# Patient Record
Sex: Male | Born: 1970 | Race: White | Hispanic: No | Marital: Single | Smoking: Former smoker
Health system: Southern US, Community
[De-identification: ages and names within clinical notes are randomized; demographics above are authoritative.]

## PROBLEM LIST (undated history)

## (undated) DIAGNOSIS — R519 Headache, unspecified: Secondary | ICD-10-CM

## (undated) HISTORY — PX: APPENDECTOMY: SHX54

---

## 2019-04-08 ENCOUNTER — Ambulatory Visit: Payer: Self-pay | Admitting: Orthopedic Surgery

## 2019-04-14 ENCOUNTER — Ambulatory Visit: Payer: Self-pay | Admitting: Orthopedic Surgery

## 2019-04-14 NOTE — H&P (Signed)
  Subjective:   Frank Walker is a pleasant 48 year old male who was in his normal state of health until He injured his self lifting heavy objects at work. Chief complaint is left-sided low back/buttock pain. Previous formalized physical therapy gave minimal Relief. Diagnostic SI joint injection provided significant (>80%), Although temporary relief. This pain continues to be significant and interfering with his quality-of-life. He would like to move forward with surgical intervention. He is scheduled for Left SI JT FUSION  Reviewed Problems Somatic dysfunction of left sacroiliac joint - Onset: 12/17/2018  Allergies  Allergen Reactions  . Ibuprofen Anaphylaxis    Social History   Tobacco Use  . Smoking status: Not on file  Substance Use Topics  . Alcohol use: Not on file    Reviewed Family History Father - No current problems or disability Mother - No current problems or disability  Review of Systems As stated in HPI  Objective:   Clinical exam: Patient is alert and oriented 3. No shortness of breath, chest pain. Heart: Regular rate and rhythm, no rubs, murmurs, or gallops Lungs: Clear auscultation bilaterally Abdomen: Soft and nontender. No loss of bowel and bladder control, no rebound tenderness . Bowel sounds 4. Lumbar spine: Ongoing significant left SI joint pain with direct patient. Positive Gleason's test, positive Faber test. Pain with direct palpation over the SI joint. Moderate low back pain with palpation and range of motion. Neuro: Intermittent dysesthesias into the left lower extremity primarily after the SI joint pain has become significant. Negative nerve root tension signs. Reflexes: Babinski: Negative  Imaging studies: Patient underwent a left SI joint injection on 01/09/19. He reports greater than 80% relief for approximately 1 week.  Assessment:   Frank Walker is a pleasant 48 year old male who was in his normal state of health until He injured his self lifting heavy  objects at work. Diagnostic SI joint injection as confirmed left SI joint as his primary pain generator. Patient would like to move forward with surgical intervention.   Plan:   Left SI joint fusion on 04/23/2019  Risks and benefits of surgery were discussed with the patient. These include: Infection, bleeding, death, stroke, paralysis, ongoing or worse pain, need for additional surgery, nonunion, mal-position of the screws.  We have also discussed the goals of surgery to include:  Goals of surgery: Reduction in pain, and improvement in quality of life.  We have also discussed the post-operative recovery period to include: bathing/showering restrictions, wound healing, activity (and driving) restrictions, medications/pain mangement.  We have also discussed post-operative redflags to include: signs and symptoms of postoperative infection, DVT/PE.  I did review the patient's medication list with him. He is not on any blood thinners and he is not taking the aspirin. He does have an NSAID listed on his medication list, but he states he has not had this for some period of time. I did advise him to avoid any anti-inflammatory medications or aspirin leading up to surgery and he did express an understanding of this.  Patient was provided crutches at today's visit and he will be nonweightbearing for 6 weeks postoperatively on the left lower extremity.  We have not obtained preoperative clearance from the patient's primary care as he is not have one. Per Dr. Rolena Infante, he feels comfortable moving forward with the surgery despite not having a PCP clearance. He will be evaluated by anesthesia at [facility] preoperatively in addition to having blood work, urine testing, chest x-ray, EKG performed.  Follow-up: 2 weeks postoperatively.

## 2019-04-14 NOTE — H&P (Deleted)
  The note originally documented on this encounter has been moved the the encounter in which it belongs.  

## 2019-04-16 NOTE — Pre-Procedure Instructions (Signed)
Frank Walker  04/16/2019      Frank Walker [facility] [place], Frank Walker. [address] [place] [address] Phone: [phone] Fax: [phone]    Your procedure is scheduled on Jan. 6  Report to [facility] entrance A at 6:30 A.M.  Call this number if you have problems the morning of surgery:  [phone]   Remember:  Do not eat or drink after midnight.      Take these medicines the morning of surgery with A SIP OF WATER : none            7 days prior to surgery STOP taking any Aspirin (unless otherwise instructed by your surgeon), Aleve, Naproxen, Ibuprofen, Motrin, Advil, Goody's, BC's, all herbal medications, fish oil, and all vitamins.    Do not wear jewelry, make-up or nail polish.  Do not wear lotions, powders, or perfumes, or deodorant.  Do not shave 48 hours prior to surgery.  Men may shave face and neck.  Do not bring valuables to the hospital.  [facility] is not responsible for any belongings or valuables.  Contacts, dentures or bridgework may not be worn into surgery.  Leave your suitcase in the car.  After surgery it may be brought to your room.  For patients admitted to the hospital, discharge time will be determined by your treatment team.  Patients discharged the day of surgery will not be allowed to drive home.    Special instructions:  Frank Walker- Preparing For Surgery  Before surgery, you can play an important role. Because skin is not sterile, your skin needs to be as free of germs as possible. You can reduce the number of germs on your skin by washing with CHG (chlorahexidine gluconate) Soap before surgery.  CHG is an antiseptic cleaner which kills germs and bonds with the skin to continue killing germs even after washing.    Oral Hygiene is also important to reduce your risk of infection.  Remember - BRUSH YOUR TEETH THE MORNING OF SURGERY WITH YOUR REGULAR TOOTHPASTE  Please do  not use if you have an allergy to CHG or antibacterial soaps. If your skin becomes reddened/irritated stop using the CHG.  Do not shave (including legs and underarms) for at least 48 hours prior to first CHG shower. It is OK to shave your face.  Please follow these instructions carefully.   1. Shower the NIGHT BEFORE SURGERY and the MORNING OF SURGERY with CHG.   2. If you chose to wash your hair, wash your hair first as usual with your normal shampoo.  3. After you shampoo, rinse your hair and body thoroughly to remove the shampoo.  4. Use CHG as you would any other liquid soap. You can apply CHG directly to the skin and wash gently with a scrungie or a clean washcloth.   5. Apply the CHG Soap to your body ONLY FROM THE NECK DOWN.  Do not use on open wounds or open sores. Avoid contact with your eyes, ears, mouth and genitals (private parts). Wash Face and genitals (private parts)  with your normal soap.  6. Wash thoroughly, paying special attention to the area where your surgery will be performed.  7. Thoroughly rinse your body with warm water from the neck down.  8. DO NOT shower/wash with your normal soap after using and rinsing off the CHG Soap.  9. Pat yourself dry with a  CLEAN TOWEL.  10. Wear CLEAN PAJAMAS to bed the night before surgery, wear comfortable clothes the morning of surgery  11. Place CLEAN SHEETS on your bed the night of your first shower and DO NOT SLEEP WITH PETS.    Day of Surgery:  Do not apply any deodorants/lotions.  Please wear clean clothes to the hospital/surgery center.   Remember to brush your teeth WITH YOUR REGULAR TOOTHPASTE.    Please read over the following fact sheets that you were given. Coughing and Deep Breathing, MRSA Information and Surgical Site Infection Prevention

## 2019-04-17 ENCOUNTER — Encounter (HOSPITAL_COMMUNITY): Payer: Self-pay

## 2019-04-17 ENCOUNTER — Ambulatory Visit (HOSPITAL_COMMUNITY)
Admission: RE | Admit: 2019-04-17 | Discharge: 2019-04-17 | Disposition: A | Discharge status: Home / Self Care | Source: Ambulatory Visit | Attending: Orthopedic Surgery | Admitting: Orthopedic Surgery

## 2019-04-17 ENCOUNTER — Encounter (HOSPITAL_COMMUNITY)
Admission: RE | Admit: 2019-04-17 | Discharge: 2019-04-17 | Disposition: A | Discharge status: Home / Self Care | Source: Ambulatory Visit | Attending: Orthopedic Surgery | Admitting: Orthopedic Surgery

## 2019-04-17 ENCOUNTER — Other Ambulatory Visit: Payer: Self-pay

## 2019-04-17 DIAGNOSIS — Z01818 Encounter for other preprocedural examination: Secondary | ICD-10-CM

## 2019-04-17 HISTORY — DX: Headache, unspecified: R51.9

## 2019-04-17 LAB — BASIC METABOLIC PANEL
Anion gap: 10 (ref 5–15)
BUN: 10 mg/dL (ref 6–20)
CO2: 22 mmol/L (ref 22–32)
Calcium: 9.7 mg/dL (ref 8.9–10.3)
Chloride: 105 mmol/L (ref 98–111)
Creatinine, Ser: 0.97 mg/dL (ref 0.61–1.24)
GFR calc Af Amer: >60 mL/min (ref >60)
GFR calc non Af Amer: >60 mL/min (ref >60)
Glucose, Bld: 89 mg/dL (ref 70–99)
Potassium: 4.2 mmol/L (ref 3.5–5.1)
Sodium: 137 mmol/L (ref 135–145)

## 2019-04-17 LAB — SURGICAL PCR SCREEN
MRSA, PCR: NEGATIVE
Staphylococcus aureus: POSITIVE — AB

## 2019-04-17 LAB — CBC
HCT: 49 % (ref 39.0–52.0)
Hemoglobin: 16.9 g/dL (ref 13.0–17.0)
MCH: 33 pg (ref 26.0–34.0)
MCHC: 34.5 g/dL (ref 30.0–36.0)
MCV: 95.7 fL (ref 80.0–100.0)
Platelets: 247 10*3/uL (ref 150–400)
RBC: 5.12 MIL/uL (ref 4.22–5.81)
RDW: 13.1 % (ref 11.5–15.5)
WBC: 7.6 10*3/uL (ref 4.0–10.5)
nRBC: 0 % (ref 0.0–0.2)

## 2019-04-17 LAB — URINALYSIS, ROUTINE W REFLEX MICROSCOPIC
Bacteria, UA: NONE SEEN
Bilirubin Urine: NEGATIVE
Glucose, UA: NEGATIVE mg/dL
Ketones, ur: NEGATIVE mg/dL
Leukocytes,Ua: NEGATIVE
Nitrite: NEGATIVE
Protein, ur: NEGATIVE mg/dL
Specific Gravity, Urine: 1.008 (ref 1.005–1.030)
pH: 6 (ref 5.0–8.0)

## 2019-04-17 LAB — PROTIME-INR
INR: 0.9 (ref 0.8–1.2)
Prothrombin Time: 12.4 seconds (ref 11.4–15.2)

## 2019-04-17 LAB — APTT: aPTT: 27 seconds (ref 24–36)

## 2019-04-17 NOTE — Anesthesia Preprocedure Evaluation (Addendum)
Anesthesia Evaluation  Patient identified by MRN, date of birth, ID band Patient awake    Reviewed: Allergy & Precautions, H&P , NPO status , Patient's Chart, lab work & pertinent test results  Airway Mallampati: I  TM Distance: >3 FB Neck ROM: Full    Dental no notable dental hx. (+) Teeth Intact, Dental Advisory Given, Caps   Pulmonary neg pulmonary ROS, former smoker,    Pulmonary exam normal breath sounds clear to auscultation       Cardiovascular Exercise Tolerance: Good negative cardio ROS Normal cardiovascular exam Rhythm:Regular Rate:Normal     Neuro/Psych  Headaches, negative psych ROS   GI/Hepatic negative GI ROS, Neg liver ROS,   Endo/Other  negative endocrine ROS  Renal/GU negative Renal ROS  negative genitourinary   Musculoskeletal negative musculoskeletal ROS (+)   Abdominal   Peds negative pediatric ROS (+)  Hematology negative hematology ROS (+)   Anesthesia Other Findings   Reproductive/Obstetrics negative OB ROS                            Anesthesia Physical Anesthesia Plan  ASA: II  Anesthesia Plan: General   Post-op Pain Management:    Induction: Intravenous  PONV Risk Score and Plan: 2 and Ondansetron, Dexamethasone and Treatment may vary due to age or medical condition  Airway Management Planned: Oral ETT and LMA  Additional Equipment:   Intra-op Plan:   Post-operative Plan: Extubation in OR  Informed Consent: I have reviewed the patients History and Physical, chart, labs and discussed the procedure including the risks, benefits and alternatives for the proposed anesthesia with the patient or authorized representative who has indicated his/her understanding and acceptance.       Plan Discussed with: Anesthesiologist, CRNA and Surgeon  Anesthesia Plan Comments: (PAT note written 04/17/2019 by Myra Gianotti, PA-C. )       Anesthesia Quick  Evaluation

## 2019-04-17 NOTE — Progress Notes (Signed)
Mupirocin Ointment called into [name] [address] for positive PCR of Staph. Pt notified of result and need to pick up Rx. Pt voiced understanding.

## 2019-04-17 NOTE — Progress Notes (Signed)
Anesthesia Chart Review:  Case: [id number] Date/Time: 04/23/19 0815   Procedure: SACROILIAC JOINT FUSION (Left )   Anesthesia type: General   Pre-op diagnosis: Left sacroiliac dysfunction   Location: [facility] OR ROOM 04 / [place] [facility]   Surgeons: Melina Schools, MD      DISCUSSION: Patient is a 48 year old male scheduled for the above procedure.  History includes former smoker (quit 04/16/06), headaches, appendectomy. BMI is consistent with obesity.  Presurgical COVID-19 test is scheduled for 04/19/2019.  Anesthesia team to evaluate on the day of surgery.   VS: BP 123/79   Pulse 67   Temp 36.8 C   Resp 18   Ht 5\' 9"  (1.753 m)   Wt 103.7 kg   SpO2 100%   BMI 33.77 kg/m    PROVIDERS: Patient, No Pcp Per   LABS: Labs reviewed: Acceptable for surgery. (all labs ordered are listed, but only abnormal results are displayed)  Labs Reviewed  SURGICAL PCR SCREEN - Abnormal; Notable for the following components:      Result Value   Staphylococcus aureus POSITIVE (*)    All other components within normal limits  URINALYSIS, ROUTINE W REFLEX MICROSCOPIC - Abnormal; Notable for the following components:   Color, Urine STRAW (*)    Hgb urine dipstick SMALL (*)    All other components within normal limits  APTT  BASIC METABOLIC PANEL  CBC  PROTIME-INR    IMAGES:  CXR 04/17/19: FINDINGS: Lung volumes are normal. Azygos lobe (normal anatomical variant) incidentally noted. No consolidative airspace disease. No pleural effusions. No pneumothorax. No pulmonary nodule or mass noted. Pulmonary vasculature and the cardiomediastinal silhouette are within normal limits. IMPRESSION: No radiographic evidence of acute cardiopulmonary disease.   EKG: N/A   CV: N/A   Past Medical History:  Diagnosis Date  . Headache     Past Surgical History:  Procedure Laterality Date  . APPENDECTOMY     as a cjild    MEDICATIONS: No current outpatient medications on file.   No current  facility-administered medications for this encounter.    Myra Gianotti, PA-C Surgical Short Stay/Anesthesiology [facility] Phone [phone] [facility] Phone [phone] 04/17/2019 5:28 PM

## 2019-04-17 NOTE — Progress Notes (Signed)
PCP - none Cardiologist - na   Chest x-ray - 04/17/19 EKG - na Stress Test - na ECHO - na Cardiac Cath - na  Sleep Study - na CPAP -   Fasting Blood Sugar - na Checks Blood Sugar _____ times a day  Blood Thinner Instructions:na Aspirin Instructions:    COVID TEST- na   Anesthesia review: order for anesthesia. consult  Patient denies shortness of breath, fever, cough and chest pain at PAT appointment   All instructions explained to the patient, with a verbal understanding of the material. Patient agrees to go over the instructions while at home for a better understanding. Patient also instructed to self quarantine after being tested for COVID-19. The opportunity to ask questions was provided.

## 2019-04-19 ENCOUNTER — Other Ambulatory Visit (HOSPITAL_COMMUNITY)
Admission: RE | Admit: 2019-04-19 | Discharge: 2019-04-19 | Disposition: A | Discharge status: Home / Self Care | Source: Ambulatory Visit | Attending: Orthopedic Surgery | Admitting: Orthopedic Surgery

## 2019-04-19 DIAGNOSIS — Z01818 Encounter for other preprocedural examination: Secondary | ICD-10-CM | POA: Diagnosis not present

## 2019-04-20 LAB — NOVEL CORONAVIRUS, NAA (HOSP ORDER, SEND-OUT TO REF LAB; TAT 18-24 HRS): SARS-CoV-2, NAA: NOT DETECTED

## 2019-04-23 ENCOUNTER — Ambulatory Visit (HOSPITAL_COMMUNITY)
Admission: RE | Admit: 2019-04-23 | Discharge: 2019-04-23 | Disposition: A | Discharge status: Home / Self Care | Attending: Orthopedic Surgery | Admitting: Orthopedic Surgery

## 2019-04-23 ENCOUNTER — Ambulatory Visit (HOSPITAL_COMMUNITY)

## 2019-04-23 ENCOUNTER — Other Ambulatory Visit: Payer: Self-pay

## 2019-04-23 ENCOUNTER — Ambulatory Visit (HOSPITAL_COMMUNITY): Admitting: Anesthesiology

## 2019-04-23 ENCOUNTER — Ambulatory Visit (HOSPITAL_COMMUNITY): Admitting: Vascular Surgery

## 2019-04-23 ENCOUNTER — Encounter (HOSPITAL_COMMUNITY)
Admission: RE | Disposition: A | Discharge status: Home / Self Care | Payer: Self-pay | Source: Home / Self Care | Attending: Orthopedic Surgery

## 2019-04-23 ENCOUNTER — Encounter (HOSPITAL_COMMUNITY): Payer: Self-pay | Admitting: Orthopedic Surgery

## 2019-04-23 DIAGNOSIS — R52 Pain, unspecified: Secondary | ICD-10-CM | POA: Diagnosis present

## 2019-04-23 DIAGNOSIS — M533 Sacrococcygeal disorders, not elsewhere classified: Secondary | ICD-10-CM | POA: Diagnosis not present

## 2019-04-23 DIAGNOSIS — Z419 Encounter for procedure for purposes other than remedying health state, unspecified: Secondary | ICD-10-CM

## 2019-04-23 DIAGNOSIS — Z87891 Personal history of nicotine dependence: Secondary | ICD-10-CM | POA: Insufficient documentation

## 2019-04-23 HISTORY — PX: SACROILIAC JOINT FUSION: SHX6088

## 2019-04-23 SURGERY — SACROILIAC JOINT FUSION
Anesthesia: General | Site: Back | Laterality: Left

## 2019-04-23 MED ORDER — LACTATED RINGERS IV SOLN: INTRAVENOUS | Status: DC | PRN

## 2019-04-23 MED ORDER — ROCURONIUM BROMIDE 10 MG/ML (PF) SYRINGE
PREFILLED_SYRINGE | INTRAVENOUS | Status: AC
  Filled 2019-04-23: qty 10

## 2019-04-23 MED ORDER — MIDAZOLAM HCL 2 MG/2ML IJ SOLN
INTRAMUSCULAR | Status: AC
  Filled 2019-04-23: qty 2

## 2019-04-23 MED ORDER — CEFAZOLIN SODIUM-DEXTROSE 2-4 GM/100ML-% IV SOLN
2.0000 g | INTRAVENOUS | Status: AC
  Administered 2019-04-23: 2 g via INTRAVENOUS

## 2019-04-23 MED ORDER — ONDANSETRON HCL 4 MG PO TABS: 4.0000 mg | ORAL_TABLET | Freq: Three times a day (TID) | ORAL | 0 refills | Status: AC | PRN

## 2019-04-23 MED ORDER — ACETAMINOPHEN 160 MG/5ML PO SOLN: 325.0000 mg | ORAL | Status: DC | PRN

## 2019-04-23 MED ORDER — OXYCODONE HCL 5 MG/5ML PO SOLN: 5.0000 mg | Freq: Once | ORAL | Status: AC | PRN

## 2019-04-23 MED ORDER — ACETAMINOPHEN 325 MG PO TABS: 325.0000 mg | ORAL_TABLET | ORAL | Status: DC | PRN

## 2019-04-23 MED ORDER — OXYCODONE HCL 5 MG PO TABS
ORAL_TABLET | ORAL | Status: AC
  Filled 2019-04-23: qty 1

## 2019-04-23 MED ORDER — MEPERIDINE HCL 25 MG/ML IJ SOLN: 6.2500 mg | INTRAMUSCULAR | Status: DC | PRN

## 2019-04-23 MED ORDER — DEXAMETHASONE SODIUM PHOSPHATE 10 MG/ML IJ SOLN
INTRAMUSCULAR | Status: DC | PRN
  Administered 2019-04-23: 10 mg via INTRAVENOUS

## 2019-04-23 MED ORDER — PROPOFOL 10 MG/ML IV BOLUS
INTRAVENOUS | Status: AC
  Filled 2019-04-23: qty 20

## 2019-04-23 MED ORDER — ONDANSETRON HCL 4 MG/2ML IJ SOLN
INTRAMUSCULAR | Status: AC
  Filled 2019-04-23: qty 2

## 2019-04-23 MED ORDER — PROPOFOL 10 MG/ML IV BOLUS
INTRAVENOUS | Status: DC | PRN
  Administered 2019-04-23: 200 mg via INTRAVENOUS

## 2019-04-23 MED ORDER — ROCURONIUM BROMIDE 10 MG/ML (PF) SYRINGE
PREFILLED_SYRINGE | INTRAVENOUS | Status: DC | PRN
  Administered 2019-04-23: 20 mg via INTRAVENOUS
  Administered 2019-04-23: 50 mg via INTRAVENOUS

## 2019-04-23 MED ORDER — BUPIVACAINE-EPINEPHRINE (PF) 0.25% -1:200000 IJ SOLN
INTRAMUSCULAR | Status: AC
  Filled 2019-04-23: qty 30

## 2019-04-23 MED ORDER — BUPIVACAINE LIPOSOME 1.3 % IJ SUSP
20.0000 mL | Freq: Once | INTRAMUSCULAR | Status: DC
  Filled 2019-04-23: qty 20

## 2019-04-23 MED ORDER — LIDOCAINE 2% (20 MG/ML) 5 ML SYRINGE
INTRAMUSCULAR | Status: AC
  Filled 2019-04-23: qty 5

## 2019-04-23 MED ORDER — METHOCARBAMOL 500 MG PO TABS: 500.0000 mg | ORAL_TABLET | Freq: Three times a day (TID) | ORAL | 0 refills | Status: AC | PRN

## 2019-04-23 MED ORDER — PHENYLEPHRINE 40 MCG/ML (10ML) SYRINGE FOR IV PUSH (FOR BLOOD PRESSURE SUPPORT)
PREFILLED_SYRINGE | INTRAVENOUS | Status: AC
  Filled 2019-04-23: qty 10

## 2019-04-23 MED ORDER — FENTANYL CITRATE (PF) 100 MCG/2ML IJ SOLN
INTRAMUSCULAR | Status: DC | PRN
  Administered 2019-04-23: 100 ug via INTRAVENOUS

## 2019-04-23 MED ORDER — LIDOCAINE 2% (20 MG/ML) 5 ML SYRINGE
INTRAMUSCULAR | Status: DC | PRN
  Administered 2019-04-23: 60 mg via INTRAVENOUS

## 2019-04-23 MED ORDER — 0.9 % SODIUM CHLORIDE (POUR BTL) OPTIME
TOPICAL | Status: DC | PRN
  Administered 2019-04-23: 1000 mL

## 2019-04-23 MED ORDER — SUGAMMADEX SODIUM 200 MG/2ML IV SOLN
INTRAVENOUS | Status: DC | PRN
  Administered 2019-04-23: 200 mg via INTRAVENOUS

## 2019-04-23 MED ORDER — BUPIVACAINE-EPINEPHRINE 0.25% -1:200000 IJ SOLN
INTRAMUSCULAR | Status: DC | PRN
  Administered 2019-04-23: 10 mL
  Administered 2019-04-23: 20 mL

## 2019-04-23 MED ORDER — OXYCODONE HCL 5 MG PO TABS
5.0000 mg | ORAL_TABLET | Freq: Once | ORAL | Status: AC | PRN
  Administered 2019-04-23: 5 mg via ORAL

## 2019-04-23 MED ORDER — BUPIVACAINE LIPOSOME 1.3 % IJ SUSP
INTRAMUSCULAR | Status: DC | PRN
  Administered 2019-04-23: 20 mL

## 2019-04-23 MED ORDER — MIDAZOLAM HCL 2 MG/2ML IJ SOLN
INTRAMUSCULAR | Status: DC | PRN
  Administered 2019-04-23 (×2): 2 mg via INTRAVENOUS

## 2019-04-23 MED ORDER — FENTANYL CITRATE (PF) 250 MCG/5ML IJ SOLN
INTRAMUSCULAR | Status: AC
  Filled 2019-04-23: qty 5

## 2019-04-23 MED ORDER — CEFAZOLIN SODIUM-DEXTROSE 2-4 GM/100ML-% IV SOLN
INTRAVENOUS | Status: AC
  Filled 2019-04-23: qty 100

## 2019-04-23 MED ORDER — PHENYLEPHRINE 40 MCG/ML (10ML) SYRINGE FOR IV PUSH (FOR BLOOD PRESSURE SUPPORT)
PREFILLED_SYRINGE | INTRAVENOUS | Status: DC | PRN
  Administered 2019-04-23 (×2): 80 ug via INTRAVENOUS

## 2019-04-23 MED ORDER — OXYCODONE-ACETAMINOPHEN 10-325 MG PO TABS: 1.0000 | ORAL_TABLET | Freq: Four times a day (QID) | ORAL | 0 refills | Status: AC | PRN

## 2019-04-23 MED ORDER — FENTANYL CITRATE (PF) 100 MCG/2ML IJ SOLN: 25.0000 ug | INTRAMUSCULAR | Status: DC | PRN

## 2019-04-23 MED ORDER — ONDANSETRON HCL 4 MG/2ML IJ SOLN: 4.0000 mg | Freq: Once | INTRAMUSCULAR | Status: DC | PRN

## 2019-04-23 MED ORDER — DEXAMETHASONE SODIUM PHOSPHATE 10 MG/ML IJ SOLN
INTRAMUSCULAR | Status: AC
  Filled 2019-04-23: qty 1

## 2019-04-23 SURGICAL SUPPLY — 55 items
BIT DRILL CANNULATED 7.0X3.2MM (BIT) IMPLANT
BLADE SURG 11 STRL SS (BLADE) ×3 IMPLANT
CLOSURE STERI-STRIP 1/2X4 (GAUZE/BANDAGES/DRESSINGS) ×1
CLSR STERI-STRIP ANTIMIC 1/2X4 (GAUZE/BANDAGES/DRESSINGS) ×2 IMPLANT
COVER SURGICAL LIGHT HANDLE (MISCELLANEOUS) ×3 IMPLANT
COVER WAND RF STERILE (DRAPES) ×3 IMPLANT
DRAPE C-ARM 42X72 X-RAY (DRAPES) ×3 IMPLANT
DRAPE C-ARMOR (DRAPES) ×3 IMPLANT
DRAPE SURG 17X23 STRL (DRAPES) ×3 IMPLANT
DRAPE U-SHAPE 47X51 STRL (DRAPES) ×3 IMPLANT
DRILL CANNULATED 7.0X3.2MM (BIT) ×3
DRSG OPSITE POSTOP 3X4 (GAUZE/BANDAGES/DRESSINGS) IMPLANT
DRSG OPSITE POSTOP 4X6 (GAUZE/BANDAGES/DRESSINGS) ×2 IMPLANT
DURAPREP 26ML APPLICATOR (WOUND CARE) ×3 IMPLANT
ELECT BLADE 4.0 EZ CLEAN MEGAD (MISCELLANEOUS) ×3
ELECT PENCIL ROCKER SW 15FT (MISCELLANEOUS) ×3 IMPLANT
ELECT REM PT RETURN 9FT ADLT (ELECTROSURGICAL) ×3
ELECTRODE BLDE 4.0 EZ CLN MEGD (MISCELLANEOUS) ×1 IMPLANT
ELECTRODE REM PT RTRN 9FT ADLT (ELECTROSURGICAL) ×1 IMPLANT
GLOVE BIOGEL PI IND STRL 8.5 (GLOVE) ×1 IMPLANT
GLOVE BIOGEL PI INDICATOR 8.5 (GLOVE) ×2
GLOVE SS BIOGEL STRL SZ 8.5 (GLOVE) ×1 IMPLANT
GLOVE SUPERSENSE BIOGEL SZ 8.5 (GLOVE) ×2
GOWN STRL REUS W/ TWL LRG LVL3 (GOWN DISPOSABLE) ×1 IMPLANT
GOWN STRL REUS W/TWL 2XL LVL3 (GOWN DISPOSABLE) ×3 IMPLANT
GOWN STRL REUS W/TWL LRG LVL3 (GOWN DISPOSABLE) ×2
IMPL IFUSE 3D 7X35 (Rod) IMPLANT
IMPL IFUSE 7.0X50 (Rod) IMPLANT
IMPLANT IFUSE 3D 7X35 (Rod) ×3 IMPLANT
IMPLANT IFUSE 7.0MMX40MM (Rod) ×2 IMPLANT
IMPLANT IFUSE 7.0X50 (Rod) ×3 IMPLANT
KIT BASIN OR (CUSTOM PROCEDURE TRAY) ×3 IMPLANT
KIT TURNOVER KIT B (KITS) ×3 IMPLANT
NEEDLE 22X1 1/2 (OR ONLY) (NEEDLE) ×3 IMPLANT
NS IRRIG 1000ML POUR BTL (IV SOLUTION) ×3 IMPLANT
PACK LAMINECTOMY ORTHO (CUSTOM PROCEDURE TRAY) ×3 IMPLANT
PACK UNIVERSAL I (CUSTOM PROCEDURE TRAY) ×3 IMPLANT
PAD ARMBOARD 7.5X6 YLW CONV (MISCELLANEOUS) ×6 IMPLANT
PIN PUSH 3.2MM (PIN) ×2 IMPLANT
PIN STEINMAN (PIN) ×2 IMPLANT
PIN STEINMAN 3.2MM (PIN) ×6 IMPLANT
POSITIONER HEAD PRONE TRACH (MISCELLANEOUS) ×3 IMPLANT
STAPLER VISISTAT 35W (STAPLE) ×3 IMPLANT
SUT MNCRL AB 3-0 PS2 18 (SUTURE) ×2 IMPLANT
SUT VIC AB 1 CT1 18XCR BRD 8 (SUTURE) ×1 IMPLANT
SUT VIC AB 1 CT1 8-18 (SUTURE) ×2
SUT VIC AB 2-0 CT1 18 (SUTURE) ×3 IMPLANT
SYR BULB IRRIGATION 50ML (SYRINGE) ×3 IMPLANT
SYR CONTROL 10ML LL (SYRINGE) ×3 IMPLANT
SYS SPNL FX3ANG 40X7X (Rod) ×1 IMPLANT
SYSTEM SPNL FX3ANG 40X7X (Rod) IMPLANT
TOWEL GREEN STERILE (TOWEL DISPOSABLE) ×3 IMPLANT
TOWEL GREEN STERILE FF (TOWEL DISPOSABLE) ×3 IMPLANT
WATER STERILE IRR 1000ML POUR (IV SOLUTION) ×3 IMPLANT
YANKAUER SUCT BULB TIP NO VENT (SUCTIONS) ×3 IMPLANT

## 2019-04-23 NOTE — Op Note (Signed)
But of report  Preoperative diagnosis: Left sacroiliac dysfunction  Postoperative diagnosis: Same  Operative procedure: Left SI joint fusion  Complications: None  First Assistant: Glynis Smiles, PA  Implants: I fuse 3D implant.  Top: 7.0 x 50 mm.  Middle: 7.0 x 40 mm.  Bottom: 7.0 x 35 mm  Indications: Frank Walker is a very pleasant 49 year old gentleman is had severe debilitating left sacroiliac dysfunction for some time now.  Pain source was confirmed with diagnostic injection therapy.  Despite appropriate conservative management his overall quality of life is continued to deteriorate and is elected to move forward with surgery.  All appropriate risks benefits and alternatives were discussed with the patient and consent was obtained.  Operative report: Patient is brought the operating room placed upon the operating room table.  After successful induction of general anesthesia and endotracheal ovation teds SCDs were applied he was turned prone onto a pelvic and chest roll.  All bony prominences well-padded and the left lateral gluteal region was prepped and draped in a standard fashion.  Timeout was taken to confirm patient procedure and all other important data.  Using lateral fluoroscopy the anatomical borders of the sacroiliac joint were marked out on the patient's skin.  I infiltrated the area with quarter percent Marcaine with epinephrine and Exparel.  Small stab incision was made guidepin was percutaneously advanced down to the lateral aspect of the iliac wing.  I confirmed satisfactory position on the lateral view so that it would advance through the SI joint and into the sacrum.  I advanced the guidepin into the ileum and up to the sacroiliac joint.  Using the inlet view I confirmed satisfactory trajectory and advanced the guidepin across the SI joint and into the sacrum.  I then switched to the outlet view and continue to progress the pin into the sacrum.  Care was taken to remain lateral to the  S1 foramen.  Once this guidepin was properly position I then used the targeting device to place the second guidepin.  The second guidepin was placed in the same fashion as the first.  Again using the lateral, inlet, and outlet view to confirm position and trajectory.  Once properly placed a third guidepin was placed in the same fashion.  An incision was made linking these 3 guide pins.  The dilating portal was then placed over the top guidepin and I measured for length.  I then used the drill to drill through the iliac wing and just to the SI joint.  I then used the broach to advance across into the sacrum.  I then placed the 7.0 x 55 mm plate and malleted it to the appropriate depth.  I confirmed trajectory and position using the outlet, inlet, and lateral view.  I remove the guidepin and then repositioned the targeting device over the middle pin.  Using the same technique I measured drilled broached and then placed the implant.  I again used fluoroscopic images to aid in the process.  I repeated this again at the bottom pin.  Once all 3 implants were in place I took my final inlet, outlet, and lateral views.  This confirmed satisfactory position of all 3 implants.  At this point I irrigated the wound copiously normal saline and injected an additional quarter percent Marcaine with epinephrine and Exparel for improved postoperative analgesia.  The deep fascia was closed in a layered fashion with interrupted #1 Vicryl suture, and then 2-0 Vicryl suture.  Skin was closed with 3-0 Monocryl.  Steri-Strips and dry dressings were applied and the patient was ultimately extubated and transferred the PACU without incident.  The end of the case all needle sponge counts were correct.  There were no adverse intraoperative events.

## 2019-04-23 NOTE — Discharge Instructions (Signed)
°SI Fusion, Care After °This sheet gives you information about how to care for yourself after your procedure. Your health care provider may also give you more specific instructions. If you have problems or questions, contact your health care provider. °What can I expect after the procedure? °After the procedure, it is common to have: °· Some pain around your incision area. °· Muscle tightening (spasms) across the back. °  °Follow these instructions at home: °Incision care °· Follow instructions from your health care provider about how to take care of your incision area. Make sure you: °? Wash your hands with soap and water before and after you apply medicine to the area or change your bandage (dressing). If soap and water are not available, use hand sanitizer. °? Change your dressing as told by your health care provider. °? Leave stitches (sutures), skin glue, or adhesive strips in place. These skin closures may need to stay in place for 2 weeks or longer. If adhesive strip edges start to loosen and curl up, you may trim the loose edges. Do not remove adhesive strips completely unless your health care provider tells you to do that. °·  °· Check your incision area every day for signs of infection. Check for: °? More redness, swelling, or pain. °? More fluid or blood. °? Warmth. °? Pus or a bad smell. °Medicines °· Take over-the-counter and prescription medicines only as told by your health care provider. °· If you were prescribed an antibiotic medicine, use it as told by your health care provider. Do not stop using the antibiotic even if you start to feel better. °· Pain medications have been electronically prescribed. °Bathing °· Do not take baths, swim, or use a hot tub for 6 weeks, or until your incision has healed completely. °· If your health care provider approves, you may take showers after your dressing has been removed. °· Ok to shower in five (5) days °Activity °· Return to your normal activities as told  by your health care provider. Ask your health care provider what activities are safe for you. °· Avoid bending or twisting at your waist. Always bend at your knees. °· Do not sit for more than 20-30 minutes at a time. Lie down or walk between periods of sitting. °· Do not lift anything that is heavier than 10 lb (4.5 kg) or the limit that your health care provider tells you, until he or she says that it is safe. °· Do not drive for 2 weeks after your procedure or for as long as your health care provider tells you. °·  °· Do not drive or use heavy machinery while taking prescription pain medicine. °· DO NOT BEAR WEIGHT ON THE LEFT LEG °General instructions °· To prevent or treat constipation while you are taking prescription pain medicine, your health care provider may recommend that you: °? Drink enough fluid to keep your urine clear or pale yellow. °? Take over-the-counter or prescription medicines. °? Eat foods that are high in fiber, such as fresh fruits and vegetables, whole grains, and beans. °? Limit foods that are high in fat and processed sugars, such as fried and sweet foods. °· Do breathing exercises as told. °· Keep all follow-up visits as told by your health care provider. This is important. °Contact a health care provider if: °· You have more redness, swelling, or pain around your incision area. °· Your incision feels warm to the touch. °· You are not able to return to activities or   do exercises as told by your health care provider. °Get help right away if: °· You have: °? More fluid or blood coming from your incision area. °? Pus or a bad smell coming from your incision area. °? Chills or a fever. °? Episodes of dizziness or fainting while standing. °· You develop a rash. °· You develop shortness of breath or you have difficulty breathing. °· You cannot control when you urinate or have a bowel movement. °· You become weak. °· You are not able to use your legs. °Summary °· After the procedure, it is  common to have some pain around your incision area. You may also have muscle tightening (spasms) across the back. °· Follow instructions from your health care provider about how to care for your incision. °· Do not lift anything that is heavier than 10 lb (4.5 kg) or the limit that your health care provider tells you, until he or she says that it is safe. °· Contact your health care provider if you have more redness, swelling, or pain around your incision area or if your incision feels warm to the touch. These can be signs of infection. °This information is not intended to replace advice given to you by your health care provider. Make sure you discuss any questions you have with your health care provider. ° ° ° ° °Crutch Mobility: Non-Weight Bearing ° ° ° °Keep weight off right leg. Grasp crutch handles securely. Do NOT lean armpits on crutches. °1.Move both crutches forward and slightly out to sides. °2.Hop up to crutches with left leg. °Repeat above sequence for each step taken. °

## 2019-04-23 NOTE — Anesthesia Postprocedure Evaluation (Signed)
Anesthesia Post Note  Patient: Frank Walker  Procedure(s) Performed: SACROILIAC JOINT FUSION (Left Back)     Patient location during evaluation: PACU Anesthesia Type: General Level of consciousness: awake and alert Pain management: pain level controlled Vital Signs Assessment: post-procedure vital signs reviewed and stable Respiratory status: spontaneous breathing, nonlabored ventilation, respiratory function stable and patient connected to nasal cannula oxygen Cardiovascular status: blood pressure returned to baseline and stable Postop Assessment: no apparent nausea or vomiting Anesthetic complications: no    Last Vitals:  Vitals:   04/23/19 1142 04/23/19 1150  BP: 107/75 120/88  Pulse: 74   Resp: 11   Temp:  (!) 36.4 C  SpO2: 98%     Last Pain:  Vitals:   04/23/19 1145  PainSc: 0-No pain                 Mckenley Birenbaum

## 2019-04-23 NOTE — H&P (Signed)
Addendum H&P Patient continues to have severe left gluteal/SI joint pain.  Despite conservative care his quality of life is continued to be adversely affected by his pain.  There is been no change in his clinical exam since his last office visit of 04/14/2019.  Plan on moving forward with left SI fusion.  I have reviewed the risks and benefits of the procedure and all of his questions were encouraged and addressed.

## 2019-04-23 NOTE — Transfer of Care (Signed)
Immediate Anesthesia Transfer of Care Note  Patient: Frank Walker  Procedure(s) Performed: SACROILIAC JOINT FUSION (Left Back)  Patient Location: PACU  Anesthesia Type:General  Level of Consciousness: awake, alert , oriented and patient cooperative  Airway & Oxygen Therapy: Patient Spontanous Breathing  Post-op Assessment: Report given to RN and Post -op Vital signs reviewed and stable  Post vital signs: Reviewed and stable  Last Vitals:  Vitals Value Taken Time  BP    Temp    Pulse 83 04/23/19 1133  Resp 13 04/23/19 1133  SpO2 97 % 04/23/19 1133  Vitals shown include unvalidated device data.  Last Pain:  Vitals:   04/23/19 0717  PainSc: 5          Complications: No apparent anesthesia complications

## 2019-04-23 NOTE — Brief Op Note (Signed)
04/23/2019  11:04 AM  PATIENT:  Mickie Kay  49 y.o. male  PRE-OPERATIVE DIAGNOSIS:  Left sacroiliac dysfunction  POST-OPERATIVE DIAGNOSIS:  Left sacroiliac dysfunction  PROCEDURE:  Procedure(s): SACROILIAC JOINT FUSION (Left)  SURGEON:  Surgeon(s) and Role:    Venita Lick, MD - Primary  PHYSICIAN ASSISTANT:   ASSISTANTS: Amanda Ward, PA   ANESTHESIA:   general  EBL:  minimal  BLOOD ADMINISTERED:none  DRAINS: none   LOCAL MEDICATIONS USED:  MARCAINE    and OTHER exparel  SPECIMEN:  No Specimen  DISPOSITION OF SPECIMEN:  N/A  COUNTS:  YES  TOURNIQUET:  * No tourniquets in log *  DICTATION: .Dragon Dictation  PLAN OF CARE: Discharge to home after PACU  PATIENT DISPOSITION:  PACU - hemodynamically stable.

## 2019-04-23 NOTE — Anesthesia Procedure Notes (Signed)
Procedure Name: Intubation Date/Time: 04/23/2019 10:03 AM Performed by: Glynda Jaeger, CRNA Pre-anesthesia Checklist: Patient identified, Emergency Drugs available, Suction available and Patient being monitored Patient Re-evaluated:Patient Re-evaluated prior to induction Oxygen Delivery Method: Circle System Utilized Preoxygenation: Pre-oxygenation with 100% oxygen Induction Type: IV induction Ventilation: Mask ventilation without difficulty Laryngoscope Size: Mac and 4 Tube type: Oral Tube size: 7.5 mm Number of attempts: 1 Airway Equipment and Method: Stylet Placement Confirmation: ETT inserted through vocal cords under direct vision,  positive ETCO2 and breath sounds checked- equal and bilateral Secured at: 23 cm Tube secured with: Tape Dental Injury: Teeth and Oropharynx as per pre-operative assessment

## 2019-04-24 ENCOUNTER — Encounter: Payer: Self-pay | Admitting: *Deleted

## 2020-08-31 IMAGING — RF DG SI JOINTS 3+V
1 series · 3 of 3 positions shown · non-contrast
Comparison: None.

CLINICAL DATA: Left SI joint fusion

EXAM:
DG C-ARM 1-60 MIN; BILATERAL SACROILIAC JOINTS - 3+ VIEW

[Series 1: run · 3 of 3 slices shown]
[im 1/3]
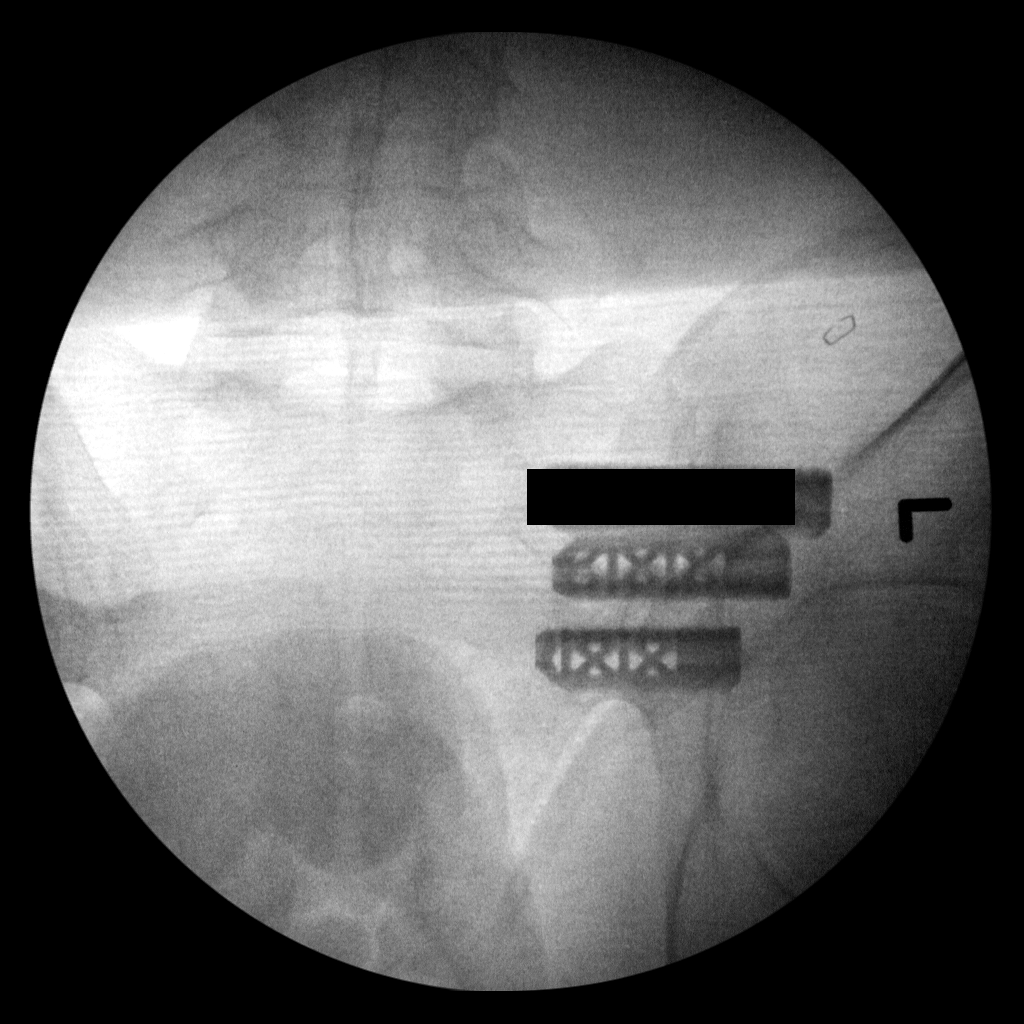
[im 2/3]
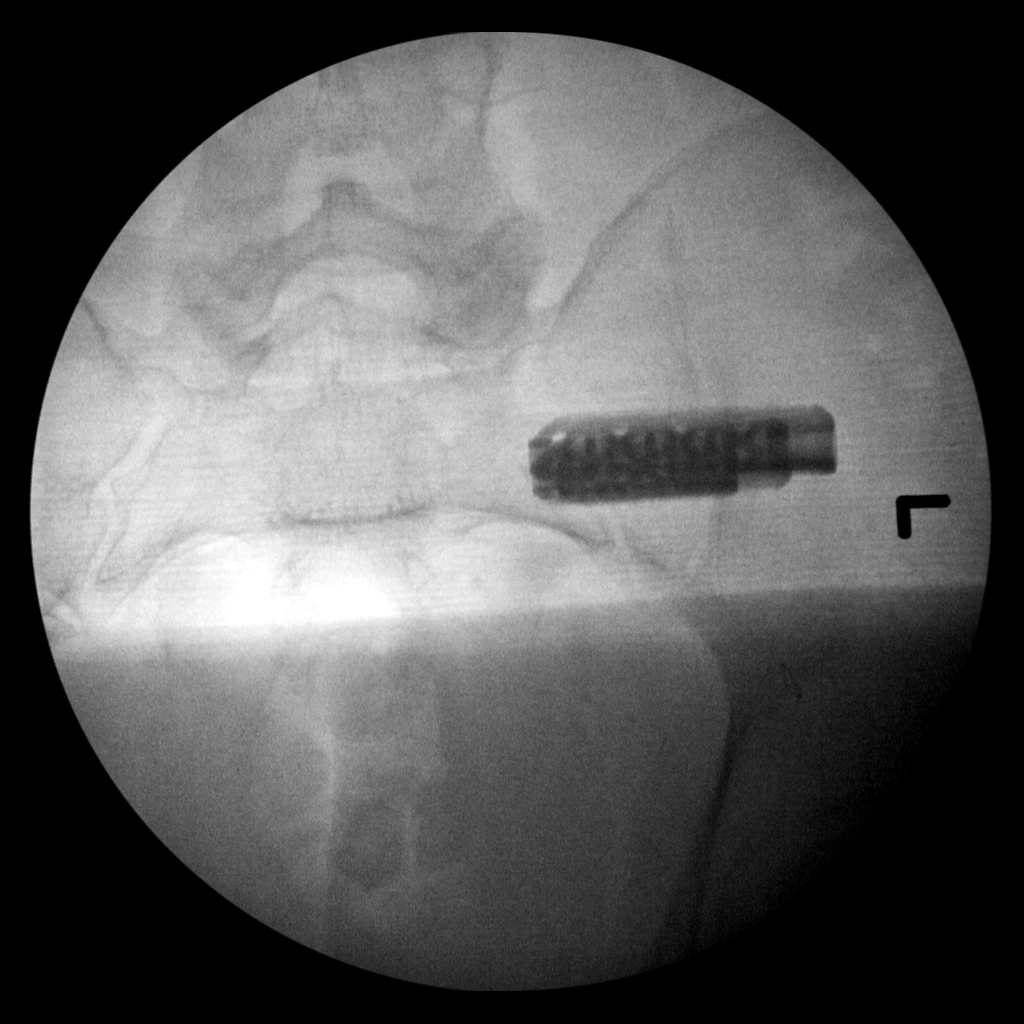
[im 3/3]
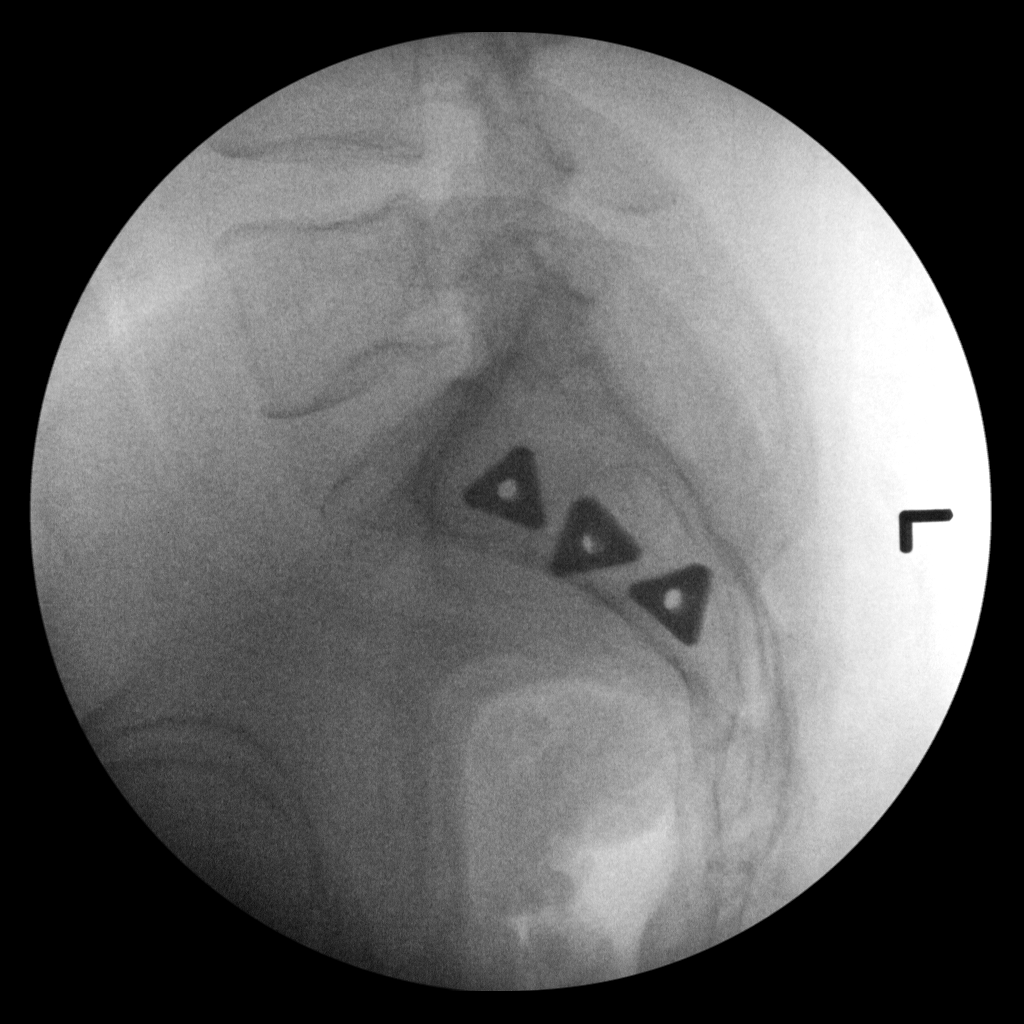

[3 of 3 positions shown; findings below may reference images not displayed]

FINDINGS: 3 C-arm fluoroscopic images were obtained intraoperatively and
submitted for post operative interpretation. Three views of the
pelvis demonstrate placement of 3 orthopedic implants traversing the
left sacroiliac joint. No appreciable complication on provided
views. 2 minutes and 30 seconds of fluoroscopy time was utilized.
Please see the performing provider's procedural report for further
detail.
IMPRESSION: As above.
